# Patient Record
Sex: Male | Born: 1995 | Race: White | Hispanic: No | Marital: Single | Smoking: Current every day smoker
Health system: Southern US, Community
[De-identification: ages and names within clinical notes are randomized; demographics above are authoritative.]

---
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
## 2013-02-12 ENCOUNTER — Encounter (HOSPITAL_COMMUNITY): Payer: Self-pay | Admitting: Emergency Medicine

## 2013-02-12 ENCOUNTER — Emergency Department (HOSPITAL_COMMUNITY)
Admission: EM | Admit: 2013-02-12 | Discharge: 2013-02-12 | Disposition: A | Discharge status: Home / Self Care | Payer: Medicaid Other | Attending: Emergency Medicine | Admitting: Emergency Medicine

## 2013-02-12 DIAGNOSIS — T310 Burns involving less than 10% of body surface: Secondary | ICD-10-CM

## 2013-02-12 DIAGNOSIS — Y929 Unspecified place or not applicable: Secondary | ICD-10-CM | POA: Insufficient documentation

## 2013-02-12 DIAGNOSIS — Y939 Activity, unspecified: Secondary | ICD-10-CM | POA: Insufficient documentation

## 2013-02-12 DIAGNOSIS — T23009A Burn of unspecified degree of unspecified hand, unspecified site, initial encounter: Secondary | ICD-10-CM | POA: Insufficient documentation

## 2013-02-12 DIAGNOSIS — X088XXA Exposure to other specified smoke, fire and flames, initial encounter: Secondary | ICD-10-CM | POA: Insufficient documentation

## 2013-02-12 DIAGNOSIS — T24009A Burn of unspecified degree of unspecified site of unspecified lower limb, except ankle and foot, initial encounter: Secondary | ICD-10-CM | POA: Insufficient documentation

## 2013-02-12 DIAGNOSIS — F17200 Nicotine dependence, unspecified, uncomplicated: Secondary | ICD-10-CM | POA: Insufficient documentation

## 2013-02-12 MED ORDER — SILVER SULFADIAZINE 1 % EX CREA
TOPICAL_CREAM | Freq: Once | CUTANEOUS | Status: AC
  Administered 2013-02-12: 17:00:00 via TOPICAL
  Filled 2013-02-12: qty 85

## 2013-02-12 MED ORDER — OXYCODONE-ACETAMINOPHEN 5-325 MG PO TABS
1.0000 | ORAL_TABLET | Freq: Once | ORAL | Status: AC
  Administered 2013-02-12: 1 via ORAL
  Filled 2013-02-12: qty 1

## 2013-02-12 MED ORDER — OXYCODONE-ACETAMINOPHEN 5-325 MG PO TABS: 1.0000 | ORAL_TABLET | ORAL | Status: DC | PRN

## 2013-02-12 MED ORDER — SILVER SULFADIAZINE 1 % EX CREA: TOPICAL_CREAM | Freq: Two times a day (BID) | CUTANEOUS | Status: DC
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
## 2013-08-20 ENCOUNTER — Emergency Department: Payer: Self-pay | Admitting: Emergency Medicine

## 2014-03-02 ENCOUNTER — Emergency Department: Payer: Self-pay | Admitting: Emergency Medicine

## 2014-07-27 ENCOUNTER — Emergency Department: Payer: Self-pay | Admitting: Emergency Medicine

## 2014-08-08 ENCOUNTER — Emergency Department: Payer: Self-pay | Admitting: Emergency Medicine

## 2015-02-07 ENCOUNTER — Emergency Department
Admission: EM | Admit: 2015-02-07 | Discharge: 2015-02-07 | Disposition: A | Discharge status: Home / Self Care | Payer: Medicaid Other | Attending: Emergency Medicine | Admitting: Emergency Medicine

## 2015-02-07 DIAGNOSIS — Y998 Other external cause status: Secondary | ICD-10-CM | POA: Insufficient documentation

## 2015-02-07 DIAGNOSIS — S0501XA Injury of conjunctiva and corneal abrasion without foreign body, right eye, initial encounter: Secondary | ICD-10-CM | POA: Insufficient documentation

## 2015-02-07 DIAGNOSIS — Z720 Tobacco use: Secondary | ICD-10-CM | POA: Insufficient documentation

## 2015-02-07 DIAGNOSIS — Y9289 Other specified places as the place of occurrence of the external cause: Secondary | ICD-10-CM | POA: Insufficient documentation

## 2015-02-07 DIAGNOSIS — X58XXXA Exposure to other specified factors, initial encounter: Secondary | ICD-10-CM | POA: Insufficient documentation

## 2015-02-07 DIAGNOSIS — Y9389 Activity, other specified: Secondary | ICD-10-CM | POA: Insufficient documentation

## 2015-02-07 MED ORDER — TETRACAINE HCL 0.5 % OP SOLN: 2.0000 [drp] | Freq: Once | OPHTHALMIC | Status: DC

## 2015-02-07 MED ORDER — TETRACAINE HCL 0.5 % OP SOLN
OPHTHALMIC | Status: DC
Start: 2015-02-07 — End: 2015-02-07
  Filled 2015-02-07: qty 2

## 2015-02-07 MED ORDER — GENTAMICIN SULFATE 0.3 % OP OINT: TOPICAL_OINTMENT | Freq: Three times a day (TID) | OPHTHALMIC | Status: DC

## 2015-02-07 MED ORDER — ACETAMINOPHEN-CODEINE #3 300-30 MG PO TABS: 1.0000 | ORAL_TABLET | ORAL | Status: DC | PRN

## 2015-02-07 MED ORDER — FLUORESCEIN SODIUM 1 MG OP STRP
ORAL_STRIP | OPHTHALMIC | Status: AC
  Filled 2015-02-07: qty 1
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
## 2015-02-07 NOTE — ED Notes (Signed)
Pt c/o right eye pain with drainage since yesterday

## 2015-02-07 NOTE — Discharge Instructions (Signed)
Corneal Abrasion The cornea is the clear covering at the front and center of the eye. When looking at the colored portion of the eye (iris), you are looking through the cornea. This very thin tissue is made up of many layers. The surface layer is a single layer of cells (corneal epithelium) and is one of the most sensitive tissues in the body. If a scratch or injury causes the corneal epithelium to come off, it is called a corneal abrasion. If the injury extends to the tissues below the epithelium, the condition is called a corneal ulcer. CAUSES   Scratches.  Trauma.  Foreign body in the eye. Some people have recurrences of abrasions in the area of the original injury even after it has healed (recurrent erosion syndrome). Recurrent erosion syndrome generally improves and goes away with time. SYMPTOMS   Eye pain.  Difficulty or inability to keep the injured eye open.  The eye becomes very sensitive to light.  Recurrent erosions tend to happen suddenly, first thing in the morning, usually after waking up and opening the eye. DIAGNOSIS  Your health care provider can diagnose a corneal abrasion during an eye exam. Dye is usually placed in the eye using a drop or a small paper strip moistened by your tears. When the eye is examined with a special light, the abrasion shows up clearly because of the dye. TREATMENT   Small abrasions may be treated with antibiotic drops or ointment alone.  A pressure patch may be put over the eye. If this is done, follow your doctor's instructions for when to remove the patch. Do not drive or use machines while the eye patch is on. Judging distances is hard to do with a patch on. If the abrasion becomes infected and spreads to the deeper tissues of the cornea, a corneal ulcer can result. This is serious because it can cause corneal scarring. Corneal scars interfere with light passing through the cornea and cause a loss of vision in the involved eye. HOME CARE  INSTRUCTIONS  Use medicine or ointment as directed. Only take over-the-counter or prescription medicines for pain, discomfort, or fever as directed by your health care provider.  Do not drive or operate machinery if your eye is patched. Your ability to judge distances is impaired.  If your health care provider has given you a follow-up appointment, it is very important to keep that appointment. Not keeping the appointment could result in a severe eye infection or permanent loss of vision. If there is any problem keeping the appointment, let your health care provider know. SEEK MEDICAL CARE IF:   You have pain, light sensitivity, and a scratchy feeling in one eye or both eyes.  Your pressure patch keeps loosening up, and you can blink your eye under the patch after treatment.  Any kind of discharge develops from the eye after treatment or if the lids stick together in the morning.  You have the same symptoms in the morning as you did with the original abrasion days, weeks, or months after the abrasion healed. MAKE SURE YOU:   Understand these instructions.  Will watch your condition.  Will get help right away if you are not doing well or get worse. Document Released: 07/10/2000 Document Revised: 07/18/2013 Document Reviewed: 03/20/2013 [facility] Patient Information 2015 [place], [place]. This information is not intended to replace advice given to you by your health care provider. Make sure you discuss any questions you have with your health care provider.   It  is very important that you follow up with the eye doctor or return to the ER for symptoms that are not improving over the next 2 days. Return here sooner for symptoms that change or worsen if you are unable to see the eye doctor.

## 2016-08-20 ENCOUNTER — Emergency Department: Payer: Medicaid Other

## 2016-08-20 ENCOUNTER — Encounter: Payer: Self-pay | Admitting: Emergency Medicine

## 2016-08-20 ENCOUNTER — Emergency Department
Admission: EM | Admit: 2016-08-20 | Discharge: 2016-08-20 | Disposition: A | Discharge status: Home / Self Care | Payer: Medicaid Other | Attending: Emergency Medicine | Admitting: Emergency Medicine

## 2016-08-20 DIAGNOSIS — F17200 Nicotine dependence, unspecified, uncomplicated: Secondary | ICD-10-CM | POA: Insufficient documentation

## 2016-08-20 DIAGNOSIS — Y939 Activity, unspecified: Secondary | ICD-10-CM | POA: Insufficient documentation

## 2016-08-20 DIAGNOSIS — S93401A Sprain of unspecified ligament of right ankle, initial encounter: Secondary | ICD-10-CM | POA: Insufficient documentation

## 2016-08-20 DIAGNOSIS — X501XXA Overexertion from prolonged static or awkward postures, initial encounter: Secondary | ICD-10-CM | POA: Insufficient documentation

## 2016-08-20 DIAGNOSIS — S93601A Unspecified sprain of right foot, initial encounter: Secondary | ICD-10-CM

## 2016-08-20 DIAGNOSIS — Y929 Unspecified place or not applicable: Secondary | ICD-10-CM | POA: Insufficient documentation

## 2016-08-20 DIAGNOSIS — Y999 Unspecified external cause status: Secondary | ICD-10-CM | POA: Insufficient documentation

## 2016-08-20 MED ORDER — NAPROXEN 500 MG PO TABS: 500.0000 mg | ORAL_TABLET | Freq: Two times a day (BID) | ORAL | 0 refills | Status: AC

## 2016-08-20 MED ORDER — OXYCODONE-ACETAMINOPHEN 5-325 MG PO TABS: 1.0000 | ORAL_TABLET | ORAL | 0 refills | Status: AC | PRN

## 2016-08-20 MED ORDER — OXYCODONE-ACETAMINOPHEN 5-325 MG PO TABS
2.0000 | ORAL_TABLET | Freq: Once | ORAL | Status: AC
  Administered 2016-08-20: 2 via ORAL
  Filled 2016-08-20: qty 2

## 2016-08-20 NOTE — ED Provider Notes (Signed)
[facility] Emergency Department Provider Note   ____________________________________________   First MD Initiated Contact with Patient 08/20/16 1836     (approximate)  I have reviewed the triage vital signs and the nursing notes.   HISTORY  Chief Complaint Ankle Pain    HPI Samanyu Tinnell is a 21 y.o. male for evaluation of right ankle pain today. Patient states that he twisted his ankle foot earlier today on a cement floor. Complaining of increased pain. Denies any numbness or tingling. Able to ambulate but with difficulty.    History reviewed. No pertinent past medical history.  There are no active problems to display for this patient.   History reviewed. No pertinent surgical history.  Prior to Admission medications   Medication Sig Start Date End Date Taking? Authorizing Provider  naproxen (NAPROSYN) 500 MG tablet Take 1 tablet (500 mg total) by mouth 2 (two) times daily with a meal. 08/20/16   Evangeline Dakin, PA-C  oxyCODONE-acetaminophen (ROXICET) 5-325 MG tablet Take 1-2 tablets by mouth every 4 (four) hours as needed for severe pain. 08/20/16   Evangeline Dakin, PA-C    Allergies Patient has no known allergies.  No family history on file.  Social History Social History  Substance Use Topics  . Smoking status: Current Every Day Smoker    Packs/day: 1.00  . Smokeless tobacco: Never Used  . Alcohol use Yes     Comment: "now and then"    Review of Systems Constitutional: No fever/chills ENT: No sore throat. Cardiovascular: Denies chest pain. Respiratory: Denies shortness of breath. Musculoskeletal: Positive for right foot and ankle pain. Skin: Negative for rash. Neurological: Negative for headaches, focal weakness or numbness.  10-point ROS otherwise negative.  ____________________________________________   PHYSICAL EXAM:  VITAL SIGNS: ED Triage Vitals [08/20/16 1815]  Enc Vitals Group     BP 116/62     Pulse Rate 86      Resp 17     Temp 98.2 F (36.8 C)     Temp Source Oral     SpO2 98 %     Weight 235 lb (106.6 kg)     Height 5\' 11"  (1.803 m)     Head Circumference      Peak Flow      Pain Score 10     Pain Loc      Pain Edu?      Excl. in GC?     Constitutional: Alert and oriented. Well appearing and in no acute distress. Neck: No stridor.   Cardiovascular: Normal rate, regular rhythm. Grossly normal heart sounds.  Good peripheral circulation. Respiratory: Normal respiratory effort.  No retractions. Lungs CTAB. Musculoskeletal: No lower extremity tenderness nor edema.  No joint effusions. Neurologic:  Normal speech and language. No gross focal neurologic deficits are appreciated. No gait instability. Skin:  Skin is warm, dry and intact. No rash noted. Psychiatric: Mood and affect are normal. Speech and behavior are normal.  ____________________________________________   LABS (all labs ordered are listed, but only abnormal results are displayed)  Labs Reviewed - No data to display ____________________________________________  EKG   ____________________________________________  RADIOLOGY  No acute osseous findings ____________________________________________   PROCEDURES  Procedure(s) performed: None  Procedures  Critical Care performed: No  ____________________________________________   INITIAL IMPRESSION / ASSESSMENT AND PLAN / ED COURSE  Pertinent labs & imaging results that were available during my care of the patient were reviewed by me and considered in my medical decision making (  see chart for details).  Acute right ankle sprain. Rx given for Percocet/Naprosyn and crutches. Patient follow-up with PCP work excuse given times one day. Patient voices no other emergency medical complaints at this visit.      ____________________________________________   FINAL CLINICAL IMPRESSION(S) / ED DIAGNOSES  Final diagnoses:  Sprain of right ankle, unspecified  ligament, initial encounter  Sprain of right foot, initial encounter      NEW MEDICATIONS STARTED DURING THIS VISIT:  New Prescriptions   NAPROXEN (NAPROSYN) 500 MG TABLET    Take 1 tablet (500 mg total) by mouth 2 (two) times daily with a meal.   OXYCODONE-ACETAMINOPHEN (ROXICET) 5-325 MG TABLET    Take 1-2 tablets by mouth every 4 (four) hours as needed for severe pain.     Note:  This document was prepared using Dragon voice recognition software and may include unintentional dictation errors.   Evangeline Dakinharles M Kinsey Karch, PA-C 08/20/16 1917    Phineas SemenGraydon Goodman, MD 08/20/16 [phone]

## 2016-08-20 NOTE — ED Triage Notes (Signed)
Patient presents to ED via POV after "twisting" right ankle today. Patient able to wiggle toes. Patient A&O x4.

## 2021-08-23 ENCOUNTER — Emergency Department: Payer: Self-pay

## 2021-08-23 DIAGNOSIS — M79675 Pain in left toe(s): Secondary | ICD-10-CM | POA: Insufficient documentation
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
## 2021-08-24 ENCOUNTER — Emergency Department
Admission: EM | Admit: 2021-08-24 | Discharge: 2021-08-24 | Disposition: A | Discharge status: Home / Self Care | Payer: Self-pay | Attending: Emergency Medicine | Admitting: Emergency Medicine

## 2021-08-24 DIAGNOSIS — M79675 Pain in left toe(s): Secondary | ICD-10-CM

## 2021-08-24 MED ORDER — IBUPROFEN 800 MG PO TABS: 800.0000 mg | ORAL_TABLET | Freq: Three times a day (TID) | ORAL | 0 refills | Status: AC | PRN

## 2021-08-24 MED ORDER — IBUPROFEN 800 MG PO TABS
800.0000 mg | ORAL_TABLET | Freq: Once | ORAL | Status: AC
  Administered 2021-08-24: 800 mg via ORAL
  Filled 2021-08-24: qty 1

## 2021-08-24 NOTE — ED Provider Notes (Signed)
[facility] Provider Note    Event Date/Time   First MD Initiated Contact with Patient 08/24/21 0037     (approximate)   History   Toe Pain   HPI  Frank Chan is a 26 y.o. male with no significant past medical history who presents to the emergency department with complaints of left second toe pain that started today.  He denies any known injury.  States he has to wear steel toed boots at work and thinks that this could be bothering his foot.  No swelling, redness or warmth.  No fever.  States it is painful to ambulate.   History provided by patient.    No past medical history on file.  No past surgical history on file.  MEDICATIONS:  Prior to Admission medications   Medication Sig Start Date End Date Taking? Authorizing Provider  naproxen (NAPROSYN) 500 MG tablet Take 1 tablet (500 mg total) by mouth 2 (two) times daily with a meal. 08/20/16   Beers, Charmayne Sheer, PA-C  oxyCODONE-acetaminophen (ROXICET) 5-325 MG tablet Take 1-2 tablets by mouth every 4 (four) hours as needed for severe pain. 08/20/16   Evangeline Dakin, PA-C    Physical Exam   Triage Vital Signs: ED Triage Vitals  Enc Vitals Group     BP 08/23/21 2043 (!) 142/113     Pulse Rate 08/23/21 2043 87     Resp 08/23/21 2043 16     Temp 08/23/21 2043 98.2 F (36.8 C)     Temp Source 08/23/21 2043 Oral     SpO2 08/23/21 2043 96 %     Weight 08/23/21 2043 210 lb (95.3 kg)     Height 08/23/21 2043 5\' 11"  (1.803 m)     Head Circumference --      Peak Flow --      Pain Score 08/23/21 2055 10     Pain Loc --      Pain Edu? --      Excl. in GC? --     Most recent vital signs: Vitals:   08/23/21 2043 08/24/21 0126  BP: (!) 142/113 (!) 138/92  Pulse: 87 80  Resp: 16 14  Temp: 98.2 F (36.8 C)   SpO2: 96% 96%     CONSTITUTIONAL: Alert and responds appropriately to questions. Well-appearing; well-nourished HEAD: Normocephalic, atraumatic EYES: Conjunctivae clear, pupils  appear equal ENT: normal nose; moist mucous membranes NECK: Normal range of motion CARD: Regular rate and rhythm RESP: Normal chest excursion without splinting or tachypnea; no hypoxia or respiratory distress, speaking full sentences ABD/GI: non-distended EXT: Normal ROM in all joints, no major deformities noted; tender to palpation over the left second toe without soft tissue swelling, redness or warmth.  Normal capillary refill.  No open wounds seen to the foot.  2+ left DP pulse.  Compartments soft.  No joint effusions. SKIN: Normal color for age and race, no rashes on exposed skin NEURO: Moves all extremities equally, normal speech, no facial asymmetry noted PSYCH: The patient's mood and manner are appropriate. Grooming and personal hygiene are appropriate.  ED Results / Procedures / Treatments   LABS: (all labs ordered are listed, but only abnormal results are displayed) Labs Reviewed - No data to display   EKG:    RADIOLOGY: My personal review and interpretation of imaging: No fracture noted to the left second toe.  I have personally reviewed all radiology reports. DG Foot Complete Left  Result Date: 08/23/2021 CLINICAL DATA:  Injury/pain/swelling.  Pt c/o pain to middle toe of LT foot after injury yesterday. Pt states no hx of the same. EXAM: LEFT FOOT - COMPLETE 3+ VIEW COMPARISON:  None. FINDINGS: There is no evidence of fracture or dislocation. Mild degenerative changes first metatarsophalangeal joint. No aggressive appearing focal bone abnormality. There are 4 punctate densities within the soft tissues along the first digit metatarsophalangeal joint. IMPRESSION: Four punctate densities within the soft tissues along the first digit metatarsophalangeal joint likely representing retained foreign bodies. Electronically Signed   By: Tish Frederickson M.D.   On: 08/23/2021 21:20     PROCEDURES:  Critical Care performed: No     Procedures    IMPRESSION / MDM / ASSESSMENT  AND PLAN / ED COURSE  I reviewed the triage vital signs and the nursing notes.   Patient here with left toe pain without known injury.     DIFFERENTIAL DIAGNOSIS (includes but not limited to):   Contusion, gout, fracture.  No sign of arterial obstruction, DVT, compartment syndrome, septic arthritis.   PLAN: X-ray, ibuprofen, crutches to use as needed.  Patient denies postop shoe.   MEDICATIONS GIVEN IN ED: Medications  ibuprofen (ADVIL) tablet 800 mg (800 mg Oral Given 08/24/21 0108)     ED COURSE: X-ray reviewed by myself and radiologist.  There is no fracture or other acute abnormality to the left second toe.  There are 4 punctate radiopaque foreign bodies noted around the left first MTP.  He has no tenderness over this area and no open wounds.  I have shown this x-ray to the patient.  He denies any history of any recent injury.  This could be chronic from his work.  This is not where he is tender.  I do not feel that this needs further emergent exploration.  Recommended Tylenol, Motrin for pain control.  Recommended rest, elevation and ice.  Given outpatient PCP follow-up.  Discharged with prescription for ibuprofen for pain control.  At this time, I do not feel there is any life-threatening condition present. I reviewed all nursing notes, vitals, pertinent previous records.  All lab and urine results, EKGs, imaging ordered have been independently reviewed and interpreted by myself.  I reviewed all available radiology reports from any imaging ordered this visit.  Based on my assessment, I feel the patient is safe to be discharged home without further emergent workup and can continue workup as an outpatient as needed. Discussed all findings, treatment plan as well as usual and customary return precautions with patient.  They verbalize understanding and are comfortable with this plan.  Outpatient follow-up has been provided as needed.  All questions have been answered.    CONSULTS: No  emergent orthopedic evaluation needed given normal x-rays.   OUTSIDE RECORDS REVIEWED: No previous relevant records for review.         FINAL CLINICAL IMPRESSION(S) / ED DIAGNOSES   Final diagnoses:  Toe pain, left     Rx / DC Orders   ED Discharge Orders          Ordered    ibuprofen (ADVIL) 800 MG tablet  Every 8 hours PRN        08/24/21 0117             Note:  This document was prepared using Dragon voice recognition software and may include unintentional dictation errors.   Jasey Cortez, Layla Maw, DO 08/24/21 [phone]

## 2021-08-24 NOTE — Discharge Instructions (Addendum)
You may alternate Tylenol 1000 mg every 6 hours as needed for pain, fever and Ibuprofen 800 mg every 6-8 hours as needed for pain, fever.  Please take Ibuprofen with food.  Do not take more than 4000 mg of Tylenol (acetaminophen) in a 24 hour period.   Xray of your 2nd toe showed no abnormality today.  Steps to find a Primary Care Provider (PCP):  Call [phone] or [phone] to access "[place] Find a Doctor Service."  2.  You may also go on the [facility] website at [email or web address]

## 2023-01-07 NOTE — Congregational Nurse Program (Signed)
  Dept: [phone]   Congregational Nurse Program Note  Date of Encounter: 01/07/2023 Client to [facility] day center with request for Rn to assess a rash to his lower back. Several areas of small open areas noted. He reports he had been itching them and they were now open. Erythema noted around several of the open areas, no drainage, no bleeding. Antibiotic ointment applied.  Advised client to try to keep the area as clean as possible over the weekend and to seek medical treatment if open areas were to worsen or spread. Francesco Runner BSN, RN Past Medical History: No past medical history on file.  Encounter Details:  CNP Questionnaire - 01/07/23 0945       Questionnaire   Ask client: Do you give verbal consent for me to treat you today? Yes    Student Assistance N/A    Location Patient Served  [facility]    Visit Setting with Client Organization    Patient Status Unhoused    Insurance Uninsured ([employer] Card/Care Connects/Self-Pay/Medicaid Family Planning)    Insurance/Financial Assistance Referral N/A   RN to offer assistance with medicaid application at next visit   Medication N/A   currently does not require any medications   Medical Provider No    Screening Referrals Made N/A    Medical Referrals Made N/A    Medical Appointment Made N/A    Recently w/o PCP, now 1st time PCP visit completed due to CNs referral or appointment made N/A    Food Have Food Insecurities    Transportation Need transportation assistance   currently using the [employer] bus when able   Housing/Utilities No permanent housing    Interpersonal Safety N/A    Interventions Advocate/Support    Abnormal to Normal Screening Since Last CN Visit N/A    Screenings CN Performed N/A   skin rash assessment   Sent Client to Lab for: N/A    Did client attend any of the following based off CNs referral or appointments made? N/A    ED Visit Averted N/A    Life-Saving Intervention Made N/A

## 2023-03-04 IMAGING — CR DG FOOT COMPLETE 3+V*L*
1 series · 3 of 3 positions shown · non-contrast
Comparison: None.

CLINICAL DATA: Injury/pain/swelling. Pt c/o pain to middle toe of
LT foot after injury yesterday. Pt states no hx of the same.

EXAM:
LEFT FOOT - COMPLETE 3+ VIEW

[Series 1: dg foot complete left · 0.14mm/px · 3 of 3 slices shown]
[im 1/3]
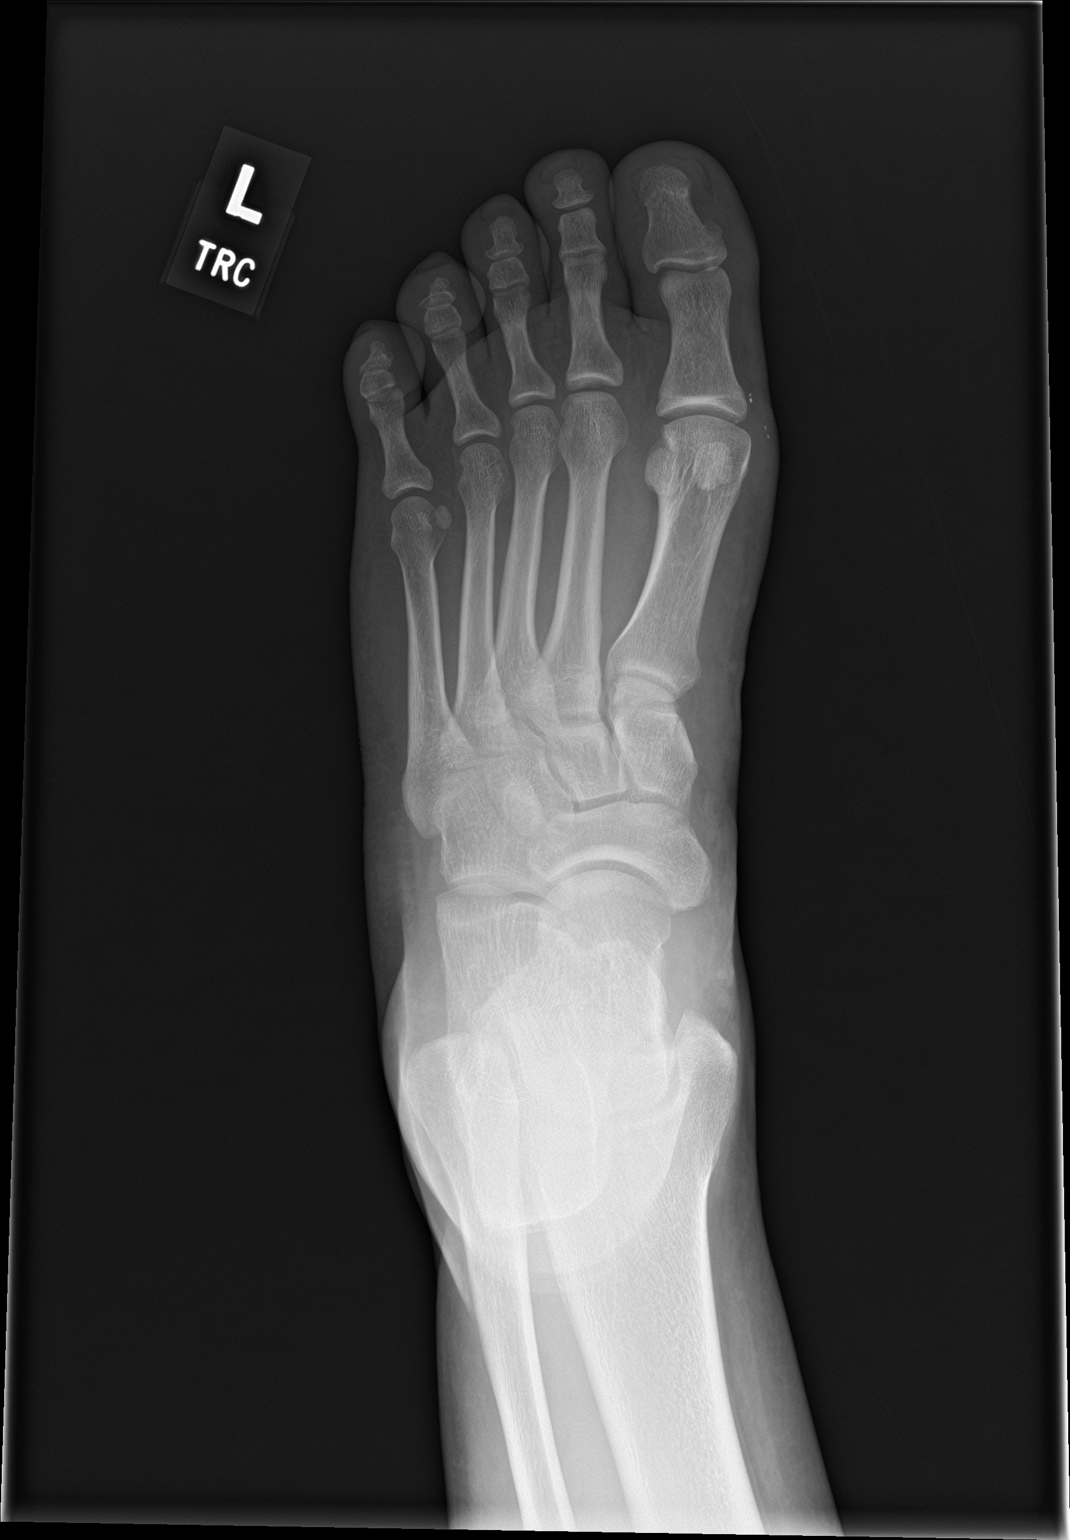
[im 2/3]
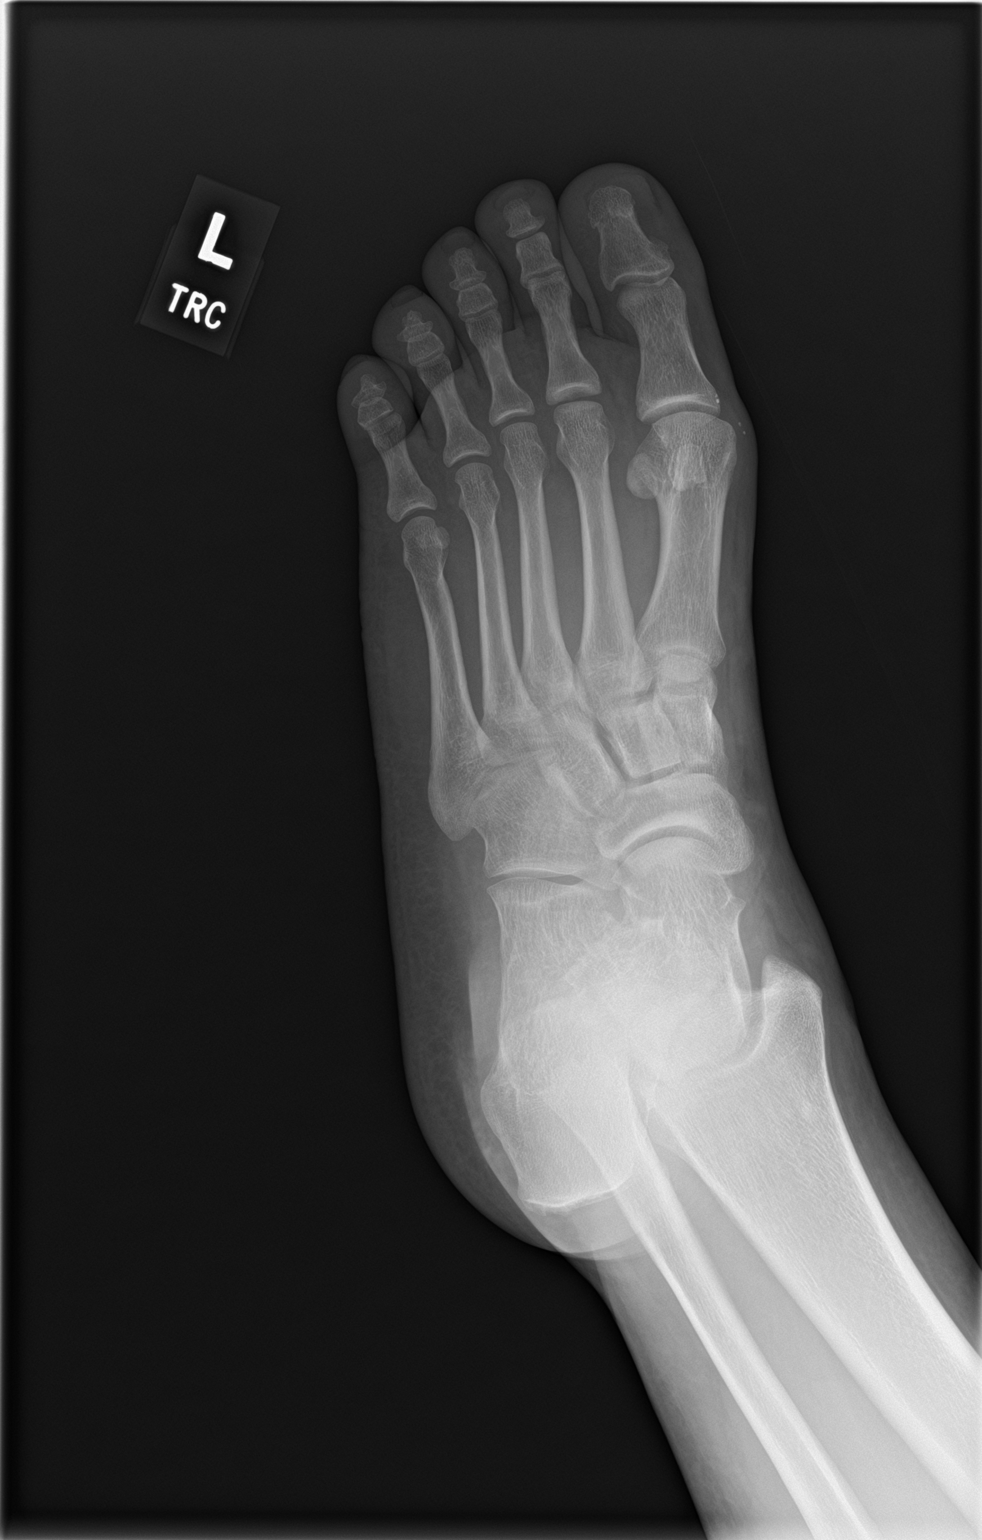
[im 3/3]
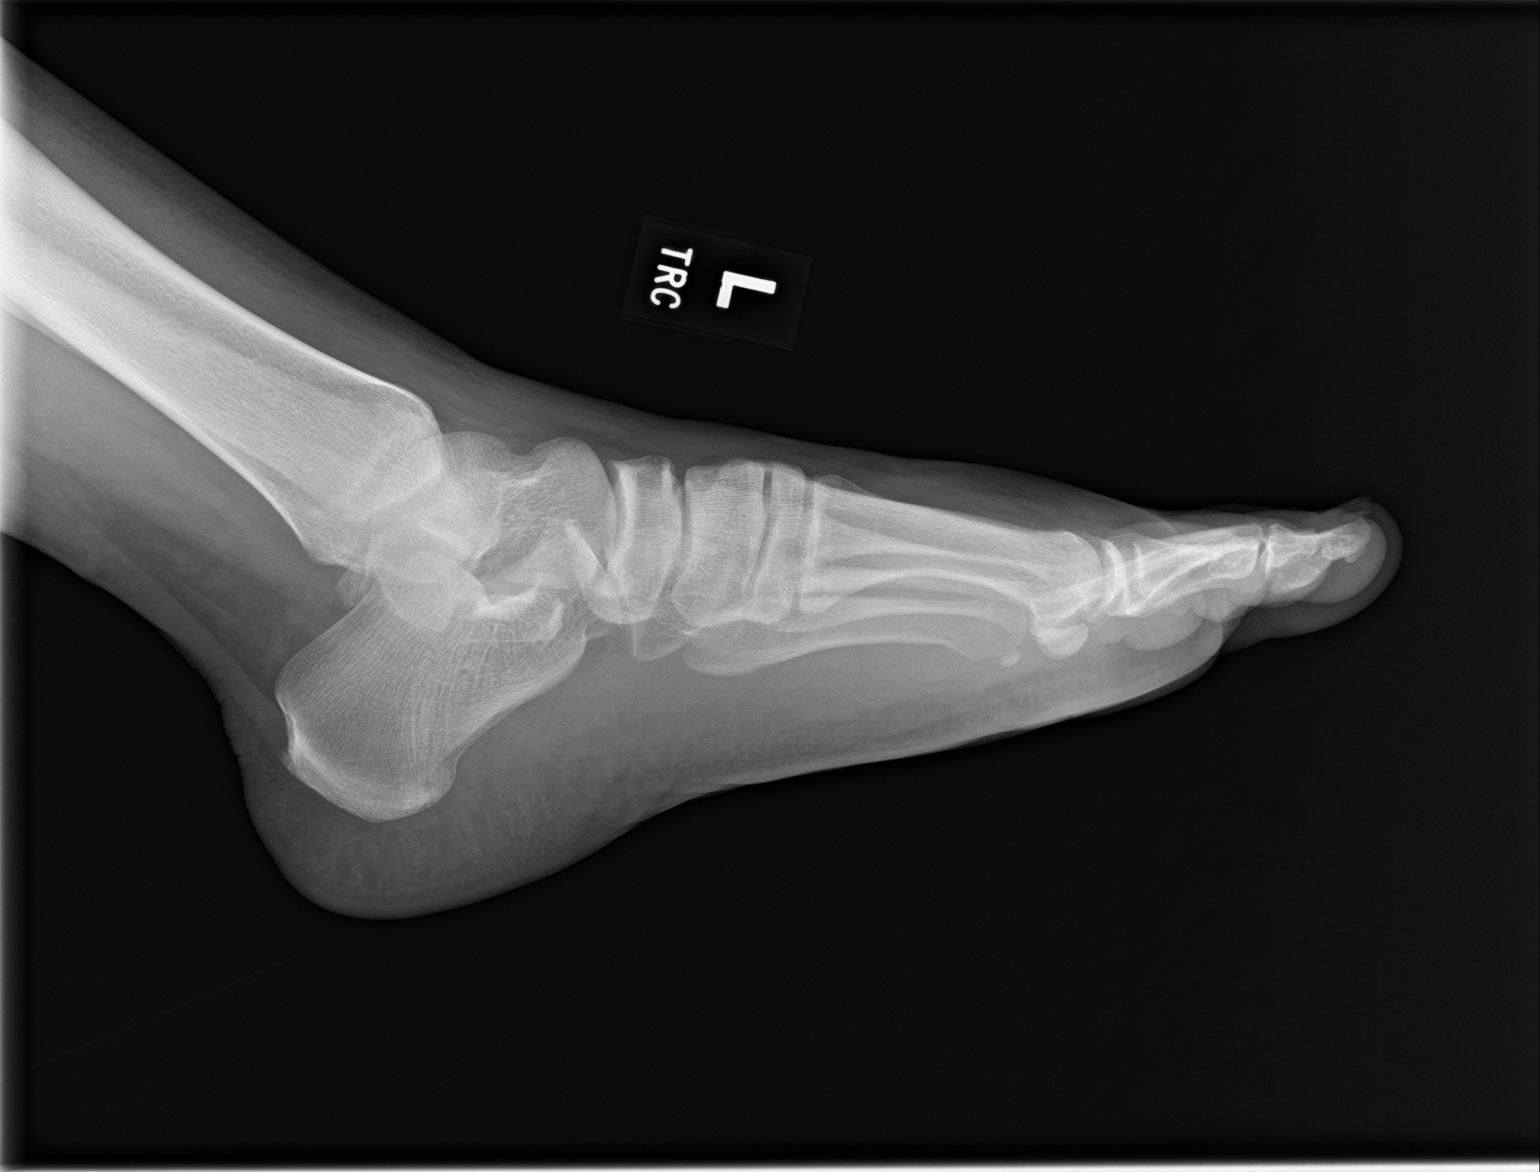

[3 of 3 positions shown; findings below may reference images not displayed]

FINDINGS: There is no evidence of fracture or dislocation. Mild degenerative
changes first metatarsophalangeal joint. No aggressive appearing
focal bone abnormality. There are 4 punctate densities within the
soft tissues along the first digit metatarsophalangeal joint.
IMPRESSION: Four punctate densities within the soft tissues along the first
digit metatarsophalangeal joint likely representing retained foreign
bodies.

## 2023-04-08 NOTE — Congregational Nurse Program (Signed)
  Dept: [phone]   Congregational Nurse Program Note  Date of Encounter: 04/08/2023 Client to [facility] with need of a dental appointment, eye exam and assistance with a Medicaid application. All completed at this visit. Medicaid application to be dropped off today. Dental apt made at [facility] dentistry for 9/18 at 3 pm. Application completed for [employer] and emailed. Client educated on this program and appreciative of assistance provided. Francesco Runner BSN, RN Past Medical History: No past medical history on file.  Encounter Details:  CNP Questionnaire - 04/08/23 0945       Questionnaire   Ask client: Do you give verbal consent for me to treat you today? Yes    Student Assistance N/A    Location Patient Served  [facility]    Visit Setting with Client Organization    Patient Status Unknown   client now has a room in a house   Insurance Uninsured ([employer] Card/Care Connects/Self-Pay/Medicaid Family Planning)    Insurance/Financial Assistance Referral Medicaid   Medicaid application completed and taken to Social Services   Medication N/A    Medical Provider No    Screening Referrals Made N/A    Medical Referrals Made Dental;Vision   Application completed for Prevent Blindness, call placed to [facility] dentistry   Medical Appointment Made Dental   [facility] 9/18 at 3 pm   Recently w/o PCP, now 1st time PCP visit completed due to CNs referral or appointment made N/A    Food N/A   has food stamps   Transportation N/A    Housing/Utilities No permanent housing    Economist N/A    Interventions Advocate/Support;Navigate Healthcare System;Case Management    Abnormal to Normal Screening Since Last CN Visit N/A    Screenings CN Performed N/A    Did client attend any of the following based off CNs referral or appointments made? N/A    ED Visit Averted N/A    Life-Saving Intervention Made N/A

## 2023-04-22 NOTE — Congregational Nurse Program (Signed)
  Dept: [phone]   Congregational Nurse Program Note  Date of Encounter: 04/21/2023 Visit made 04/21/2023 late Entry. Client to [facility] day center with request for assistance in reviewing his Medicaid information he received. Client has been approved for Medicaid and has been assigned to [facility] RN explained that he will receive a card from [facility] shortly, this is the card that will be presented when he goes for any medial care. RN also explained that client will now have transportation to medical appointments through his Medicaid. Writer also had mail for his mother, from [place]. Also reviewed. Client went to his dental apt last week, but was rescheduled for Sept 30. He does understand that after that one visit to [facility], he will need to find another dentist as they do not take Medicaid. Client appreciative of assistance. Francesco Runner BSN, RN Past Medical History: No past medical history on file.  Encounter Details:  CNP Questionnaire - 04/21/23 1045       Questionnaire   Ask client: Do you give verbal consent for me to treat you today? Yes    Student Assistance N/A    Location Patient Served  [facility]    Visit Setting with Client Organization    Patient Status Unknown   client now has a room in a house   Insurance Medicaid   client now has [employer] medicaid   Insurance/Financial Assistance Referral N/A   medicaid has been approved, client is awaiting his card from [facility]   Medication N/A    Medical Provider No    Screening Referrals Made N/A    Medical Referrals Made N/A    Medical Appointment Made Dental   [facility] Monday Sept 30   Recently w/o PCP, now 1st time PCP visit completed due to CNs referral or appointment made N/A    Food N/A   has food stamps   Transportation N/A    Housing/Utilities No permanent housing   client is living in a boarding room with his mother   Economist N/A    Interventions  Advocate/Support;Navigate Healthcare System;Case Management    Abnormal to Normal Screening Since Last CN Visit N/A    Screenings CN Performed N/A    Sent Client to Lab for: N/A    Did client attend any of the following based off CNs referral or appointments made? N/A    ED Visit Averted N/A    Life-Saving Intervention Made N/A

## 2023-06-08 ENCOUNTER — Encounter: Payer: Self-pay | Admitting: Emergency Medicine

## 2023-06-08 ENCOUNTER — Emergency Department
Admission: EM | Admit: 2023-06-08 | Discharge: 2023-06-08 | Disposition: A | Discharge status: Home / Self Care | Payer: Medicaid Other | Attending: Emergency Medicine | Admitting: Emergency Medicine

## 2023-06-08 ENCOUNTER — Other Ambulatory Visit: Payer: Self-pay

## 2023-06-08 ENCOUNTER — Emergency Department: Payer: Medicaid Other

## 2023-06-08 DIAGNOSIS — N132 Hydronephrosis with renal and ureteral calculous obstruction: Secondary | ICD-10-CM | POA: Diagnosis not present

## 2023-06-08 DIAGNOSIS — N201 Calculus of ureter: Secondary | ICD-10-CM

## 2023-06-08 DIAGNOSIS — R1032 Left lower quadrant pain: Secondary | ICD-10-CM | POA: Diagnosis present

## 2023-06-08 LAB — CBC WITH DIFFERENTIAL/PLATELET
Abs Immature Granulocytes: 0.12 10*3/uL — ABNORMAL HIGH (ref 0.00–0.07)
Basophils Absolute: 0.1 10*3/uL (ref 0.0–0.1)
Basophils Relative: 1 %
Eosinophils Absolute: 0.1 10*3/uL (ref 0.0–0.5)
Eosinophils Relative: 1 %
HCT: 42.7 % (ref 39.0–52.0)
Hemoglobin: 14.1 g/dL (ref 13.0–17.0)
Immature Granulocytes: 1 %
Lymphocytes Relative: 13 %
Lymphs Abs: 2 10*3/uL (ref 0.7–4.0)
MCH: 28.5 pg (ref 26.0–34.0)
MCHC: 33 g/dL (ref 30.0–36.0)
MCV: 86.3 fL (ref 80.0–100.0)
Monocytes Absolute: 0.9 10*3/uL (ref 0.1–1.0)
Monocytes Relative: 6 %
Neutro Abs: 11.8 10*3/uL — ABNORMAL HIGH (ref 1.7–7.7)
Neutrophils Relative %: 78 %
Platelets: 307 10*3/uL (ref 150–400)
RBC: 4.95 MIL/uL (ref 4.22–5.81)
RDW: 13.4 % (ref 11.5–15.5)
WBC: 15 10*3/uL — ABNORMAL HIGH (ref 4.0–10.5)
nRBC: 0 % (ref 0.0–0.2)

## 2023-06-08 LAB — COMPREHENSIVE METABOLIC PANEL
ALT: 34 U/L (ref 0–44)
AST: 41 U/L (ref 15–41)
Albumin: 4 g/dL (ref 3.5–5.0)
Alkaline Phosphatase: 67 U/L (ref 38–126)
Anion gap: 9 (ref 5–15)
BUN: 16 mg/dL (ref 6–20)
CO2: 25 mmol/L (ref 22–32)
Calcium: 9 mg/dL (ref 8.9–10.3)
Chloride: 104 mmol/L (ref 98–111)
Creatinine, Ser: 1 mg/dL (ref 0.61–1.24)
GFR, Estimated: >60 mL/min (ref >60)
Glucose, Bld: 128 mg/dL — ABNORMAL HIGH (ref 70–99)
Potassium: 4 mmol/L (ref 3.5–5.1)
Sodium: 138 mmol/L (ref 135–145)
Total Bilirubin: 0.2 mg/dL (ref <1.2)
Total Protein: 6.7 g/dL (ref 6.5–8.1)

## 2023-06-08 LAB — URINALYSIS, W/ REFLEX TO CULTURE (INFECTION SUSPECTED)
Bacteria, UA: NONE SEEN
Bilirubin Urine: NEGATIVE
Glucose, UA: NEGATIVE mg/dL
Ketones, ur: NEGATIVE mg/dL
Leukocytes,Ua: NEGATIVE
Nitrite: NEGATIVE
Protein, ur: NEGATIVE mg/dL
RBC / HPF: >50 RBC/hpf (ref 0–5)
Specific Gravity, Urine: 1.043 — ABNORMAL HIGH (ref 1.005–1.030)
Squamous Epithelial / HPF: 0 /[HPF] (ref 0–5)
pH: 8 (ref 5.0–8.0)

## 2023-06-08 MED ORDER — TAMSULOSIN HCL 0.4 MG PO CAPS
0.4000 mg | ORAL_CAPSULE | Freq: Every day | ORAL | 0 refills | Status: AC
  Filled 2023-06-08: qty 10, 10d supply, fill #0

## 2023-06-08 MED ORDER — ONDANSETRON 4 MG PO TBDP
4.0000 mg | ORAL_TABLET | Freq: Three times a day (TID) | ORAL | 0 refills | Status: AC | PRN
  Filled 2023-06-08: qty 20, 7d supply, fill #0

## 2023-06-08 MED ORDER — TAMSULOSIN HCL 0.4 MG PO CAPS
0.4000 mg | ORAL_CAPSULE | ORAL | Status: AC
  Administered 2023-06-08: 0.4 mg via ORAL
  Filled 2023-06-08: qty 1

## 2023-06-08 MED ORDER — IOHEXOL 300 MG/ML  SOLN
100.0000 mL | Freq: Once | INTRAMUSCULAR | Status: AC | PRN
  Administered 2023-06-08: 100 mL via INTRAVENOUS

## 2023-06-08 MED ORDER — KETOROLAC TROMETHAMINE 15 MG/ML IJ SOLN
15.0000 mg | Freq: Once | INTRAMUSCULAR | Status: AC
  Administered 2023-06-08: 15 mg via INTRAVENOUS
  Filled 2023-06-08: qty 1

## 2023-06-08 MED ORDER — KETOROLAC TROMETHAMINE 10 MG PO TABS
10.0000 mg | ORAL_TABLET | Freq: Four times a day (QID) | ORAL | 0 refills | Status: AC | PRN
  Filled 2023-06-08: qty 12, 3d supply, fill #0

## 2023-06-08 MED ORDER — ONDANSETRON HCL 4 MG/2ML IJ SOLN
4.0000 mg | Freq: Once | INTRAMUSCULAR | Status: AC
  Administered 2023-06-08: 4 mg via INTRAVENOUS
  Filled 2023-06-08: qty 2

## 2023-06-08 MED ORDER — SODIUM CHLORIDE 0.9 % IV BOLUS
1000.0000 mL | Freq: Once | INTRAVENOUS | Status: AC
  Administered 2023-06-08: 1000 mL via INTRAVENOUS

## 2023-06-08 MED ORDER — NAPROXEN 500 MG PO TABS
500.0000 mg | ORAL_TABLET | Freq: Once | ORAL | Status: AC
  Administered 2023-06-08: 500 mg via ORAL
  Filled 2023-06-08: qty 1

## 2023-06-08 NOTE — ED Provider Notes (Signed)
[facility] Provider Note    Event Date/Time   First MD Initiated Contact with Patient 06/08/23 [phone]     (approximate)   History   Chief Complaint: Abdominal pain  HPI  Frank Chan is a 27 y.o. male with no past medical history who comes the ED complaining of left lower quadrant abdominal pain associated with nausea vomiting and diarrhea that started last night.  Tried to eat this morning but vomited right away.  Also has chills.  Endorses urinary frequency.          Physical Exam   Triage Vital Signs: ED Triage Vitals  Encounter Vitals Group     BP      Systolic BP Percentile      Diastolic BP Percentile      Pulse      Resp      Temp      Temp src      SpO2      Weight      Height      Head Circumference      Peak Flow      Pain Score      Pain Loc      Pain Education      Exclude from Growth Chart     Most recent vital signs: Vitals:   06/08/23 0759  BP: (!) 174/109  Pulse: (!) 54  Resp: 18  Temp: (!) 97.4 F (36.3 C)  SpO2: 100%    General: Awake, no distress.  CV:  Good peripheral perfusion.  Resp:  Normal effort.  Abd:  No distention.  Soft, left lower quadrant tenderness Other:  Moist oral mucosa   ED Results / Procedures / Treatments   Labs (all labs ordered are listed, but only abnormal results are displayed) Labs Reviewed  COMPREHENSIVE METABOLIC PANEL - Abnormal; Notable for the following components:      Result Value   Glucose, Bld 128 (*)    All other components within normal limits  CBC WITH DIFFERENTIAL/PLATELET - Abnormal; Notable for the following components:   WBC 15.0 (*)    Neutro Abs 11.8 (*)    Abs Immature Granulocytes 0.12 (*)    All other components within normal limits  URINALYSIS, W/ REFLEX TO CULTURE (INFECTION SUSPECTED) - Abnormal; Notable for the following components:   Color, Urine STRAW (*)    APPearance CLEAR (*)    Specific Gravity, Urine 1.043 (*)    Hgb urine dipstick  MODERATE (*)    All other components within normal limits     EKG     RADIOLOGY CT abdomen pelvis interpreted by me, shows a small stone in the distal left ureter.  No obstruction.  Radiology report reviewed   PROCEDURES:  Procedures   MEDICATIONS ORDERED IN ED: Medications  naproxen (NAPROSYN) tablet 500 mg (has no administration in time range)  sodium chloride 0.9 % bolus 1,000 mL (0 mLs Intravenous Stopped 06/08/23 0920)  ondansetron (ZOFRAN) injection 4 mg (4 mg Intravenous Given 06/08/23 0820)  ketorolac (TORADOL) 15 MG/ML injection 15 mg (15 mg Intravenous Given 06/08/23 0820)  iohexol (OMNIPAQUE) 300 MG/ML solution 100 mL (100 mLs Intravenous Contrast Given 06/08/23 0832)  tamsulosin (FLOMAX) capsule 0.4 mg (0.4 mg Oral Given 06/08/23 0921)     IMPRESSION / MDM / ASSESSMENT AND PLAN / ED COURSE  I reviewed the triage vital signs and the nursing notes.  DDx: Diverticulitis, viral gastroenteritis, bowel obstruction, dehydration, ureterolithiasis  Patient's presentation is  most consistent with acute presentation with potential threat to life or bodily function.  Patient presents with left lower quadrant abdominal pain with focal tenderness, most worrisome for diverticulitis.  Will obtain CT, labs, give IV fluids Toradol Zofran.   ----------------------------------------- 10:15 AM on 06/08/2023 ----------------------------------------- Patient feeling better, pain and nausea resolved.  Tolerating oral intake.  Informed of results, prescriptions sent to community pharmacy as patient reports that he does not have the few dollars that would be needed for his Medicaid co-pay at a retail pharmacy.      FINAL CLINICAL IMPRESSION(S) / ED DIAGNOSES   Final diagnoses:  Ureterolithiasis     Rx / DC Orders   ED Discharge Orders          Ordered    ketorolac (TORADOL) 10 MG tablet  Every 6 hours PRN       Note to Pharmacy: Continuation of parenteral therapy    06/08/23 1011    ondansetron (ZOFRAN-ODT) 4 MG disintegrating tablet  Every 8 hours PRN        06/08/23 1011    tamsulosin (FLOMAX) 0.4 MG CAPS capsule  Daily        06/08/23 1011             Note:  This document was prepared using Dragon voice recognition software and may include unintentional dictation errors.   Sharman Cheek, MD 06/08/23 1016

## 2023-06-08 NOTE — ED Triage Notes (Signed)
Presents via EMS from home  States he developed abd pain with some n/v  States pain is mainly in left side   Abd is very tender on palpation  Provider in room on arrival

## 2023-06-15 NOTE — Congregational Nurse Program (Signed)
  Dept: [phone]   Congregational Nurse Program Note  Date of Encounter: 06/15/2023 Client to [facility] day center with request for assistance with understanding his mail. He had several mailings from his insurance company [facility]) along with his new insurance care. RN also assisted client in finding a dentist locally that accepts Medicaid. Client will make his own appointment at [facility] at his request. RN also reviewed clients discharge information from when he was seen at [facility] ED for a kidney stone. He was able to pick up all 3 prescriptions he was given. He reports he has had no more pain, but is unsure of he passed the kidney stone he was found to have. He has continued to drink plenty of water and is trying to cut down on his sweet tea and soft drinks. Francesco Runner BSN, RN Past Medical History: No past medical history on file.  Encounter Details:  Community Questionnaire - 06/15/23 1311       Questionnaire   Ask client: Do you give verbal consent for me to treat you today? Yes    Student Assistance N/A    Location Patient Served  [facility]    Encounter Setting CN site    Population Status Unknown   client now has a room in a house   Insurance Medicaid   client now has [employer] medicaid   Insurance/Financial Assistance Referral N/A   medicaid has been approved, client is awaiting his card from [facility]   Medication N/A    Medical Provider No    Screening Referrals Made N/A    Medical Referrals Made Dental    Medical Appointment Completed N/A    CNP Interventions Advocate/Support;Navigate Healthcare System;Case Management    Screenings CN Performed N/A    ED Visit Averted N/A    Life-Saving Intervention Made N/A

## 2023-07-27 ENCOUNTER — Other Ambulatory Visit: Payer: Self-pay
